# Patient Record
Sex: Female | Born: 1990 | Race: White | Hispanic: No | Marital: Single | Smoking: Never smoker
Health system: Southern US, Community
[De-identification: ages and names within clinical notes are randomized; demographics above are authoritative.]

---

## 2011-08-14 ENCOUNTER — Emergency Department (HOSPITAL_COMMUNITY)
Admission: EM | Admit: 2011-08-14 | Discharge: 2011-08-14 | Disposition: A | Discharge status: Home / Self Care | Payer: BC Managed Care – PPO | Attending: Emergency Medicine | Admitting: Emergency Medicine

## 2011-08-14 ENCOUNTER — Encounter (HOSPITAL_COMMUNITY): Payer: Self-pay | Admitting: Family Medicine

## 2011-08-14 ENCOUNTER — Emergency Department (HOSPITAL_COMMUNITY): Payer: BC Managed Care – PPO

## 2011-08-14 DIAGNOSIS — R112 Nausea with vomiting, unspecified: Secondary | ICD-10-CM | POA: Insufficient documentation

## 2011-08-14 DIAGNOSIS — R109 Unspecified abdominal pain: Secondary | ICD-10-CM | POA: Insufficient documentation

## 2011-08-14 DIAGNOSIS — R1110 Vomiting, unspecified: Secondary | ICD-10-CM

## 2011-08-14 DIAGNOSIS — R197 Diarrhea, unspecified: Secondary | ICD-10-CM | POA: Insufficient documentation

## 2011-08-14 LAB — DIFFERENTIAL
Basophils Absolute: 0 10*3/uL (ref 0.0–0.1)
Lymphocytes Relative: 2 % — ABNORMAL LOW (ref 12–46)
Neutro Abs: 15.5 10*3/uL — ABNORMAL HIGH (ref 1.7–7.7)

## 2011-08-14 LAB — COMPREHENSIVE METABOLIC PANEL
ALT: 10 U/L (ref 0–35)
AST: 15 U/L (ref 0–37)
Albumin: 4.7 g/dL (ref 3.5–5.2)
CO2: 21 mEq/L (ref 19–32)
Calcium: 9.7 mg/dL (ref 8.4–10.5)
Chloride: 101 mEq/L (ref 96–112)
GFR calc non Af Amer: >90 mL/min (ref >90)
Sodium: 138 mEq/L (ref 135–145)

## 2011-08-14 LAB — CBC
Platelets: 271 10*3/uL (ref 150–400)
RDW: 12.6 % (ref 11.5–15.5)
WBC: 16.3 10*3/uL — ABNORMAL HIGH (ref 4.0–10.5)

## 2011-08-14 MED ORDER — IOHEXOL 300 MG/ML  SOLN
100.0000 mL | Freq: Once | INTRAMUSCULAR | Status: AC | PRN
  Administered 2011-08-14: 80 mL via INTRAVENOUS

## 2011-08-14 MED ORDER — MORPHINE SULFATE 4 MG/ML IJ SOLN
4.0000 mg | Freq: Once | INTRAMUSCULAR | Status: AC
  Administered 2011-08-14: 4 mg via INTRAVENOUS
  Filled 2011-08-14: qty 1

## 2011-08-14 MED ORDER — ONDANSETRON HCL 4 MG/2ML IJ SOLN
4.0000 mg | Freq: Once | INTRAMUSCULAR | Status: AC
  Administered 2011-08-14: 4 mg via INTRAVENOUS
  Filled 2011-08-14: qty 2

## 2011-08-14 MED ORDER — ONDANSETRON HCL 4 MG PO TABS: 4.0000 mg | ORAL_TABLET | Freq: Four times a day (QID) | ORAL | Status: AC

## 2011-08-14 MED ORDER — PROMETHAZINE HCL 25 MG/ML IJ SOLN
25.0000 mg | Freq: Once | INTRAMUSCULAR | Status: AC
  Administered 2011-08-14: 25 mg via INTRAVENOUS

## 2011-08-14 MED ORDER — PROMETHAZINE HCL 25 MG/ML IJ SOLN
12.5000 mg | Freq: Once | INTRAMUSCULAR | Status: DC
  Filled 2011-08-14: qty 1

## 2011-08-14 NOTE — ED Notes (Signed)
Pt from Health center at [facility] with high WBC and abdominal pain.

## 2011-08-14 NOTE — ED Notes (Addendum)
Drinking contrast for CT---c/o nausea.  Family member at bedside.

## 2011-08-14 NOTE — ED Notes (Signed)
MD in to discuss CT results with pt. And Mom---Sprite given per pt's request.

## 2011-08-14 NOTE — ED Notes (Signed)
Sleeping on carrier--awakens with verbal---Has about 200 more ml left in saline bag---Mom wants to wait until she receives all fluid before discharge.

## 2011-08-14 NOTE — ED Provider Notes (Signed)
History     CSN: [id number]  Arrival date & time 08/14/11  1617   First MD Initiated Contact with Patient 08/14/11 1736      Chief Complaint  Patient presents with  . Nausea  . Emesis  . Diarrhea  . Abdominal Pain    (Consider location/radiation/quality/duration/timing/severity/associated sxs/prior treatment) Patient is a 21 y.o. female presenting with vomiting, diarrhea, and abdominal pain. The history is provided by the patient.  Emesis  This is a new problem. The current episode started 6 to 12 hours ago. The problem occurs continuously. The problem has not changed since onset.The emesis has an appearance of stomach contents. The maximum temperature recorded prior to her arrival was 100 to 100.9 F. The fever has been present for less than 1 day. Associated symptoms include abdominal pain, chills, diarrhea and a fever. Pertinent negatives include no cough and no URI. Risk factors: No ill contacts, suspected food intake or recent travel antibiotics.  Diarrhea The primary symptoms include fever, abdominal pain, vomiting and diarrhea. The illness began today (Only one episode of mild loose stool). The onset was sudden. The problem has been resolved.  The abdominal pain began today. The abdominal pain has been gradually worsening since its onset. The abdominal pain is located in the RLQ, periumbilical region and epigastric region. The abdominal pain does not radiate. The severity of the abdominal pain is 7/10. The abdominal pain is relieved by vomiting, movement and certain positions.  The illness is also significant for chills. Associated medical issues do not include gallstones or PUD.  Abdominal Pain The primary symptoms of the illness include abdominal pain, fever, vomiting and diarrhea.  Additional symptoms associated with the illness include chills. Significant associated medical issues do not include PUD or gallstones.    History reviewed. No pertinent past medical  history.  History reviewed. No pertinent past surgical history.  History reviewed. No pertinent family history.  History  Substance Use Topics  . Smoking status: Never Smoker   . Smokeless tobacco: Not on file  . Alcohol Use: No    OB History    Grav Para Term Preterm Abortions TAB SAB Ect Mult Living                  Review of Systems  Constitutional: Positive for fever and chills.  Respiratory: Negative for cough.   Gastrointestinal: Positive for vomiting, abdominal pain and diarrhea.  All other systems reviewed and are negative.    Allergies  Penicillins  Home Medications  No current outpatient prescriptions on file.  BP 134/72  Pulse 107  Temp(Src) 99.9 F (37.7 C) (Oral)  Resp 20  Ht 5\' 7"  (1.702 m)  Wt 124 lb (56.246 kg)  BMI 19.42 kg/m2  SpO2 100%  Physical Exam  Nursing note and vitals reviewed. Constitutional: She is oriented to person, place, and time. She appears well-developed and well-nourished. She appears distressed.  HENT:  Head: Normocephalic and atraumatic.  Mouth/Throat: Mucous membranes are dry.  Eyes: EOM are normal. Pupils are equal, round, and reactive to light.  Cardiovascular: Regular rhythm, normal heart sounds and intact distal pulses.  Tachycardia present.  Exam reveals no friction rub.   No murmur heard. Pulmonary/Chest: Effort normal and breath sounds normal. She has no wheezes. She has no rales.  Abdominal: Soft. Bowel sounds are normal. She exhibits no distension. There is tenderness in the right lower quadrant and periumbilical area. There is guarding. There is no rebound.  Musculoskeletal: Normal range of motion. She  exhibits no tenderness.       No edema  Neurological: She is alert and oriented to person, place, and time. No cranial nerve deficit.  Skin: Skin is warm and dry. No rash noted.  Psychiatric: She has a normal mood and affect. Her behavior is normal.    ED Course  Procedures (including critical care  time)  Labs Reviewed  CBC - Abnormal; Notable for the following:    WBC 16.3 (*)    All other components within normal limits  DIFFERENTIAL - Abnormal; Notable for the following:    Neutrophils Relative 95 (*)    Neutro Abs 15.5 (*)    Lymphocytes Relative 2 (*)    Lymphs Abs 0.4 (*)    All other components within normal limits  COMPREHENSIVE METABOLIC PANEL - Abnormal; Notable for the following:    Potassium 3.4 (*)    Glucose, Bld 114 (*)    Total Protein 8.4 (*)    All other components within normal limits   Ct Abdomen Pelvis W Contrast  08/14/2011  *RADIOLOGY REPORT*  Clinical Data: Nausea, vomiting and upper abdominal pain.  Elevated white blood cell count.  CT ABDOMEN AND PELVIS WITH CONTRAST  Technique:  Multidetector CT imaging of the abdomen and pelvis was performed following the standard protocol during bolus administration of intravenous contrast.  Contrast: 80mL OMNIPAQUE IOHEXOL 300 MG/ML IV SOLN  Comparison: None.  Findings: The lung bases are clear.  No pleural or pericardial effusion.  The liver, gallbladder, adrenal glands, spleen, pancreas and kidneys appear normal.  The appendix is visualized and normal in appearance.  The stomach and small and large bowel are normal in appearance.  Small amount of free pelvic fluid is compatible with physiologic change.  Uterus and adnexa are normal in appearance. There is no focal bony abnormality.  IMPRESSION: Negative exam.  No finding to explain the patient's symptoms.  Original Report Authenticated By: Bernadene Bell. Maricela Curet, M.D.     No diagnosis found.    MDM   The patient presented with nausea vomiting mild diarrhea and abdominal pain. She was seen at an urgent care and sent here for further evaluation patient had elevated white blood cell count of 13.9 But denies any urinary symptoms and was HCG negative.  Patient's complaining of persistent right abdominal pain. She has no rebound but does have some involuntary guarding in the  right lower quadrant. Will give IV hydration, antibiotic and pain medication. CMP, UA, CT of abdomen and pelvis to rule out appendicitis pending.  8:58 PM  Leukocytosis of 16,000 but otherwise normal CMP, UA and CT of the abdomen negative for appendicitis. On reevaluation nausea and pain have improved and patient is tolerating oral fluids. Full discharge and have return for any problems.      Gwyneth Sprout, MD 08/14/11 2059

## 2011-08-14 NOTE — ED Notes (Signed)
Pt reports waking up at 04:00 this morning with severe N/V/D and upper abdominal pain. Reports dark vaginal discharge a few days ago.

## 2012-12-03 IMAGING — CT CT ABD-PELV W/ CM
1 of 2 series · 15 of 32 positions shown, 20 images · IV contrast (APPLIED)
Comparison: None.

CLINICAL DATA: Nausea, vomiting and upper abdominal pain.  Elevated
white blood cell count.

CT ABDOMEN AND PELVIS WITH CONTRAST
TECHNIQUE: Multidetector CT imaging of the abdomen and pelvis was
performed following the standard protocol during bolus
administration of intravenous contrast.
Contrast: 80mL OMNIPAQUE IOHEXOL 300 MG/ML IV SOLN

[Series 2: abd/pel with · axial · 0.79mm/px · z∈[+842,+1257]mm · 15 of 91 slices shown, 20 images]
[im 4/91  soft-tissue]
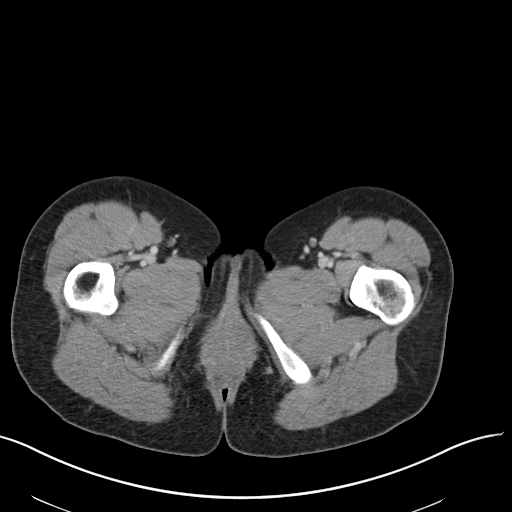
[im 4/91  bone]
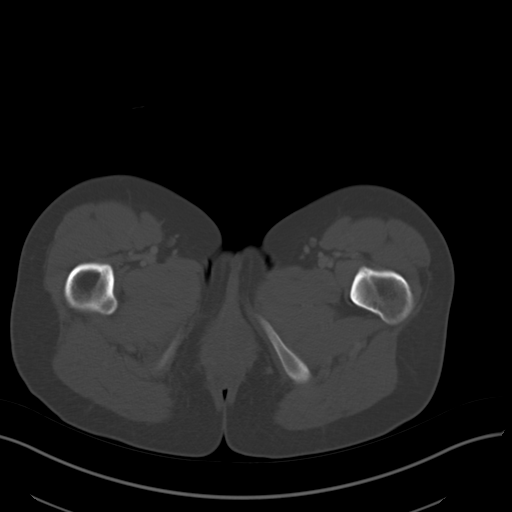
[im 12/91  soft-tissue]
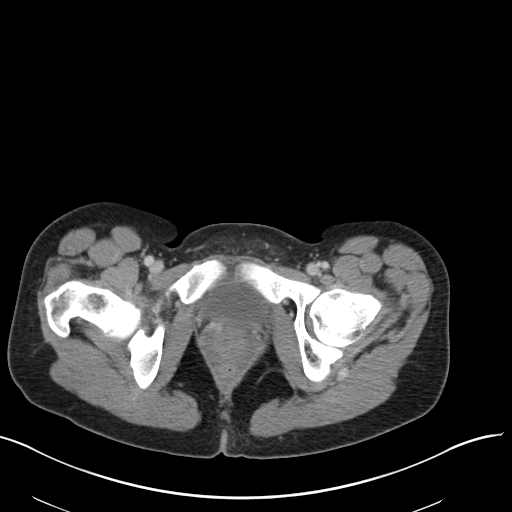
[im 16/91  soft-tissue]
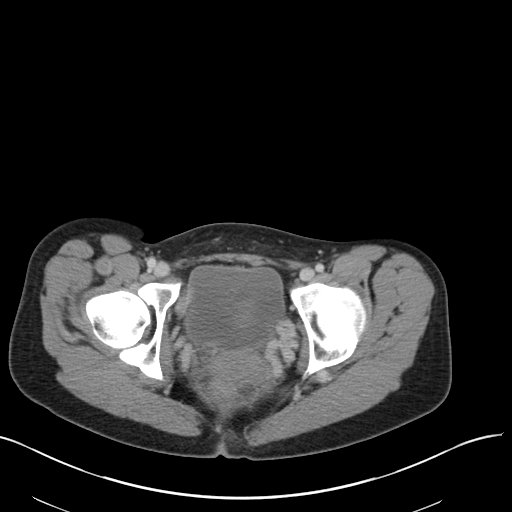
[im 24/91  soft-tissue]
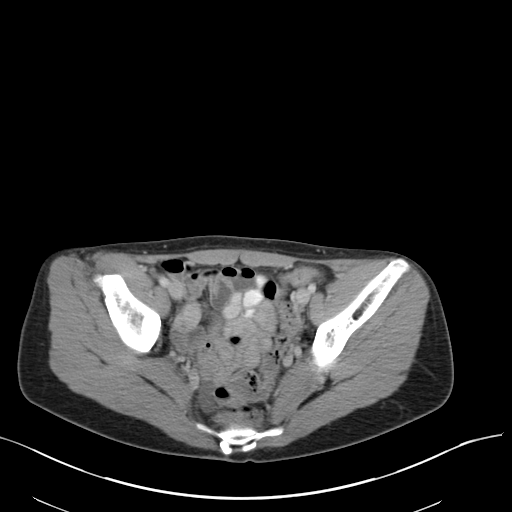
[im 32/91  soft-tissue]
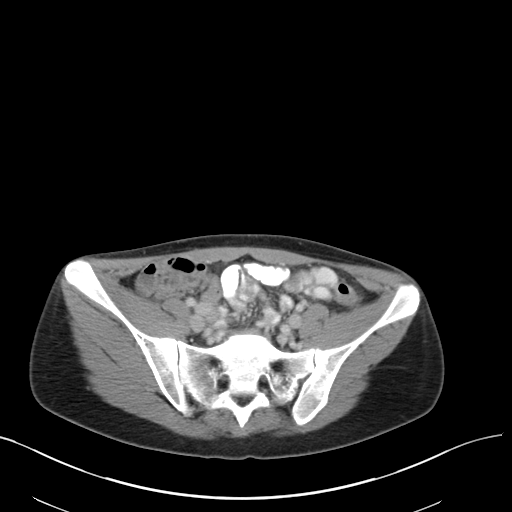
[im 36/91  soft-tissue]
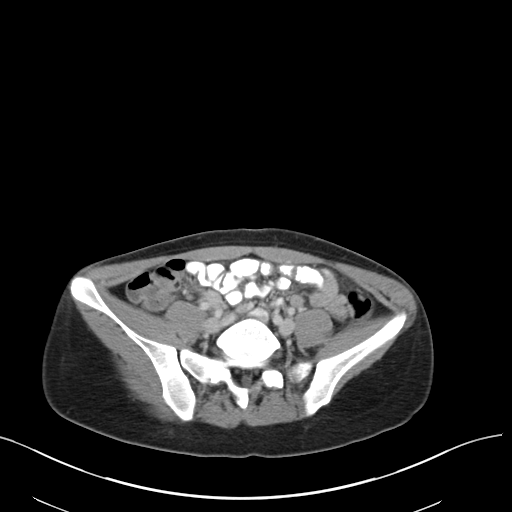
[im 44/91  soft-tissue]
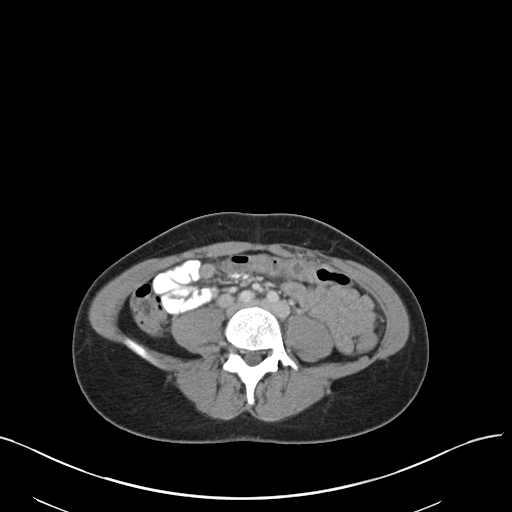
[im 47/91  soft-tissue]
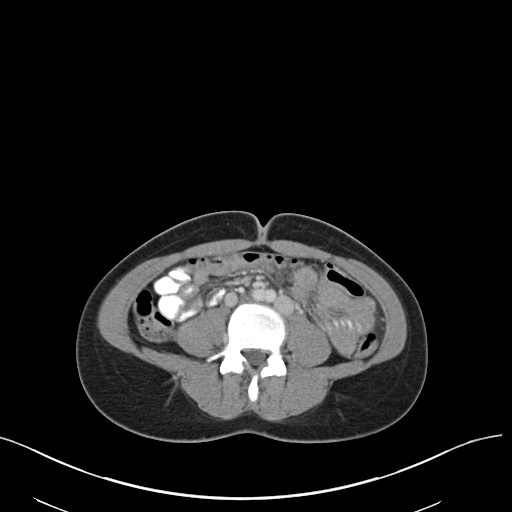
[im 55/91  soft-tissue]
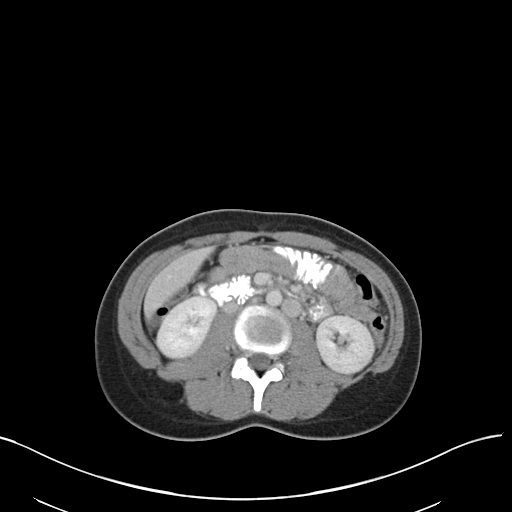
[im 55/91  bone]
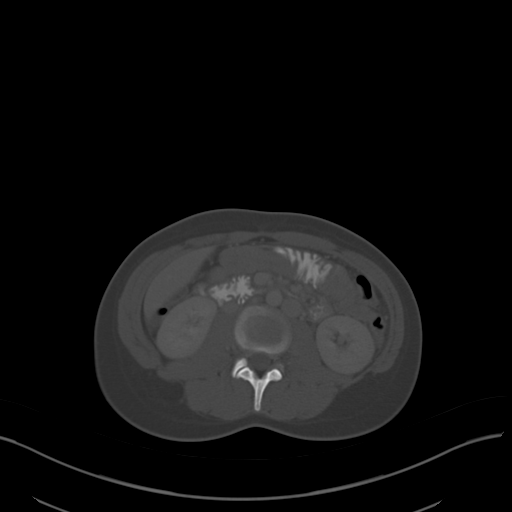
[im 59/91  soft-tissue]
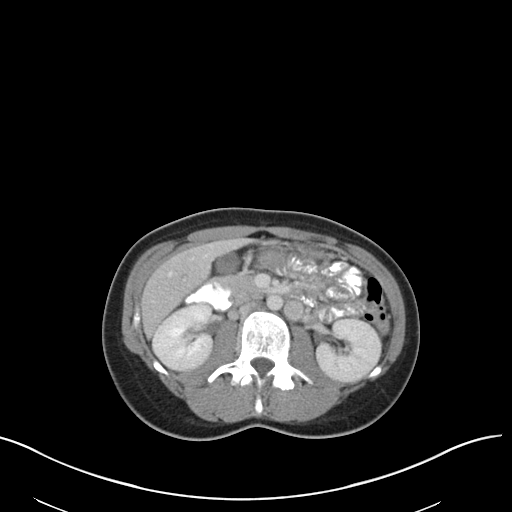
[im 67/91  soft-tissue]
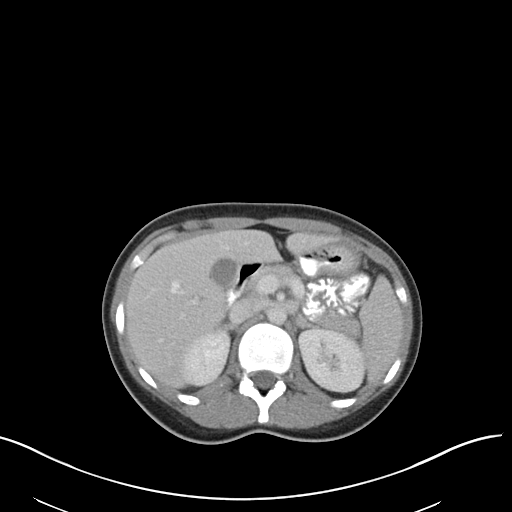
[im 75/91  soft-tissue]
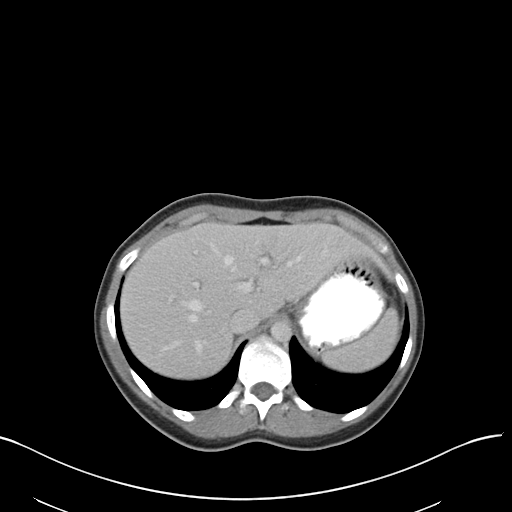
[im 75/91  lung]
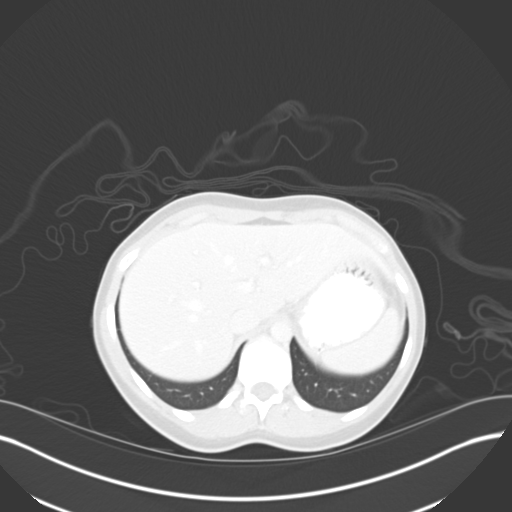
[im 79/91  soft-tissue]
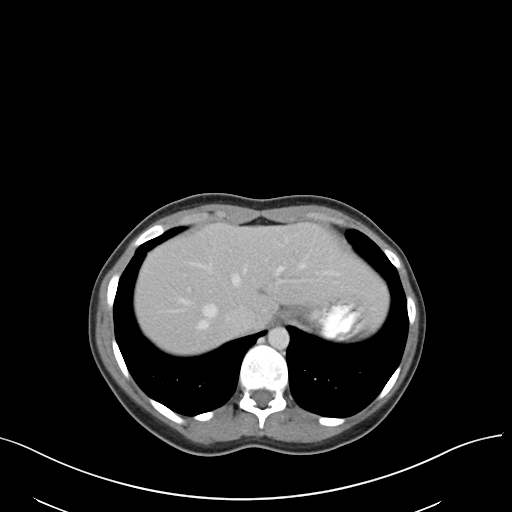
[im 79/91  lung]
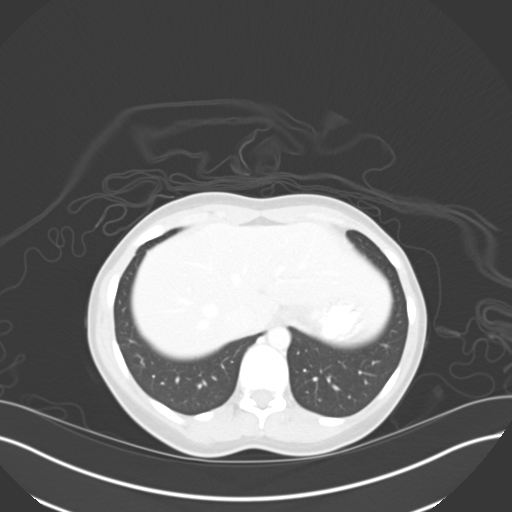
[im 83/91  lung]
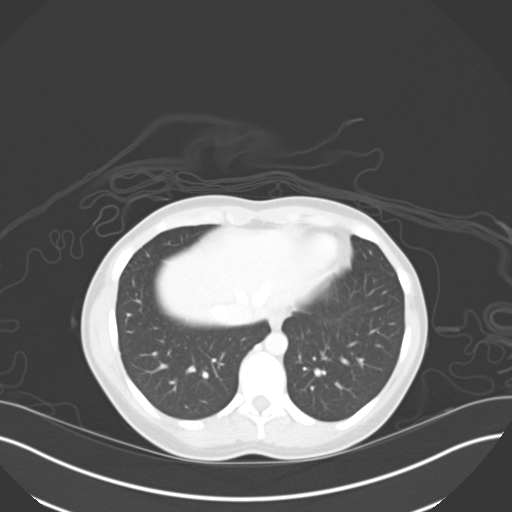
[im 87/91  soft-tissue]
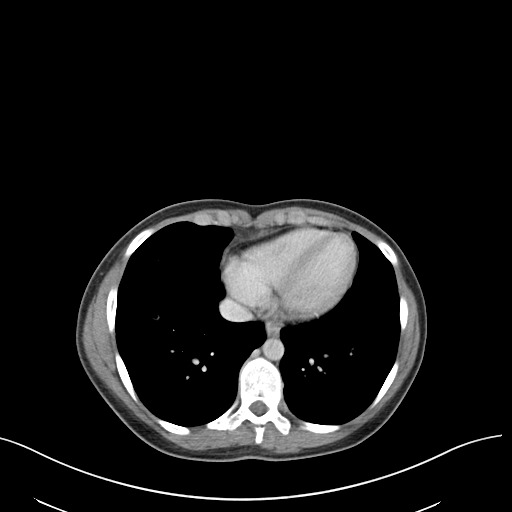
[im 87/91  lung]
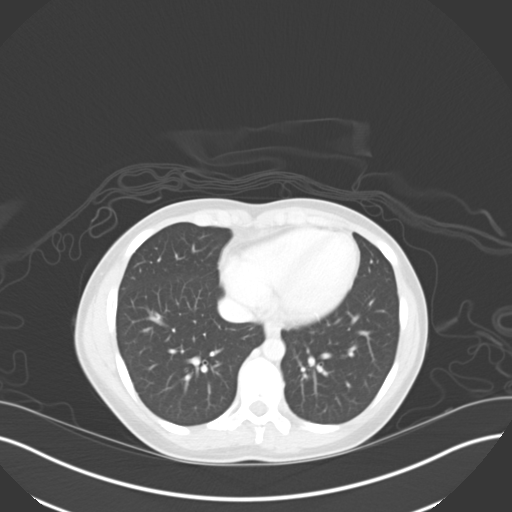

[15 of 32 positions shown; findings below may reference images not displayed]

FINDINGS: The lung bases are clear.  No pleural or pericardial
effusion.

The liver, gallbladder, adrenal glands, spleen, pancreas and
kidneys appear normal.  The appendix is visualized and normal in
appearance.  The stomach and small and large bowel are normal in
appearance.  Small amount of free pelvic fluid is compatible with
physiologic change.  Uterus and adnexa are normal in appearance.
There is no focal bony abnormality.
IMPRESSION: Negative exam.  No finding to explain the patient's symptoms.

## 2013-08-16 ENCOUNTER — Other Ambulatory Visit: Payer: Self-pay | Admitting: Gastroenterology

## 2013-08-16 DIAGNOSIS — R112 Nausea with vomiting, unspecified: Secondary | ICD-10-CM

## 2013-08-23 ENCOUNTER — Other Ambulatory Visit: Payer: Self-pay | Admitting: Gastroenterology

## 2013-08-23 ENCOUNTER — Ambulatory Visit
Admission: RE | Admit: 2013-08-23 | Discharge: 2013-08-23 | Disposition: A | Discharge status: Home / Self Care | Payer: BC Managed Care – PPO | Source: Ambulatory Visit | Attending: Gastroenterology | Admitting: Gastroenterology

## 2013-08-23 DIAGNOSIS — R112 Nausea with vomiting, unspecified: Secondary | ICD-10-CM

## 2014-12-13 IMAGING — US US ABDOMEN LIMITED
1 series · 14 of 25 positions shown · non-contrast
Comparison: None.

CLINICAL DATA: Nausea and vomiting

EXAM:
US ABDOMEN LIMITED - RIGHT UPPER QUADRANT

[Series 1: us abdomen limited · 0.22mm/px · 14 of 40 slices shown]
[im 1/40]
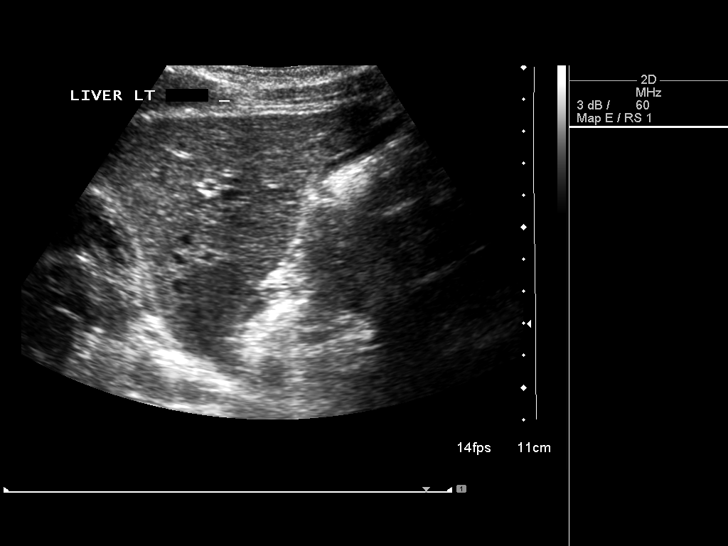
[im 4/40]
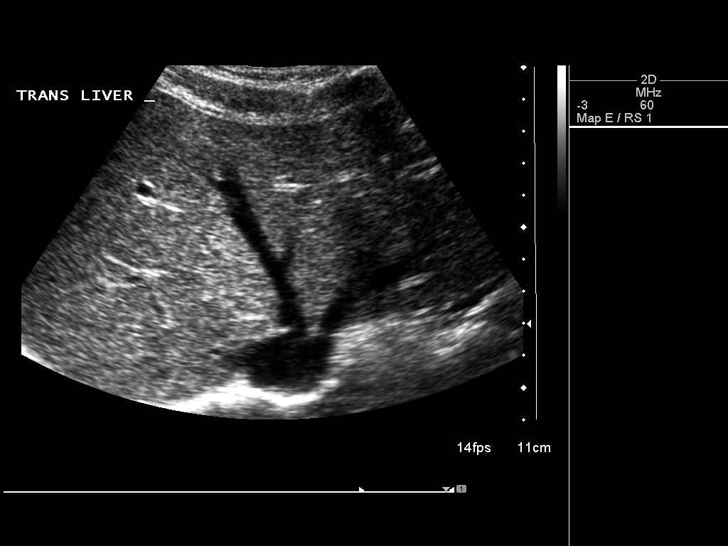
[im 7/40]
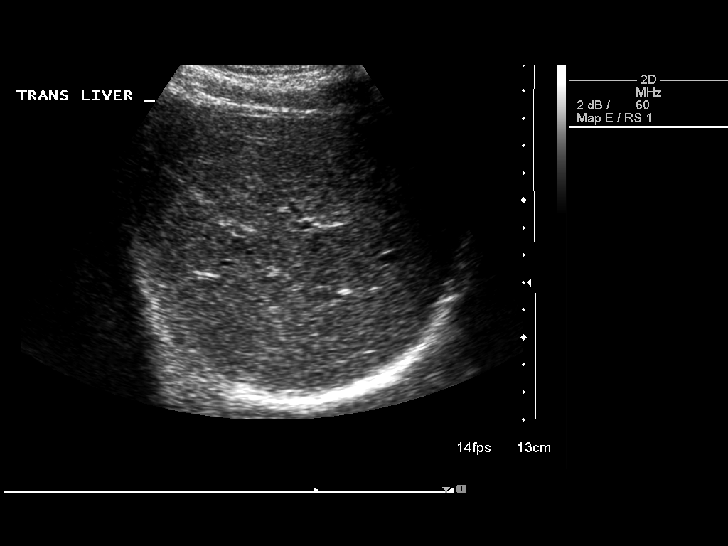
[im 10/40]
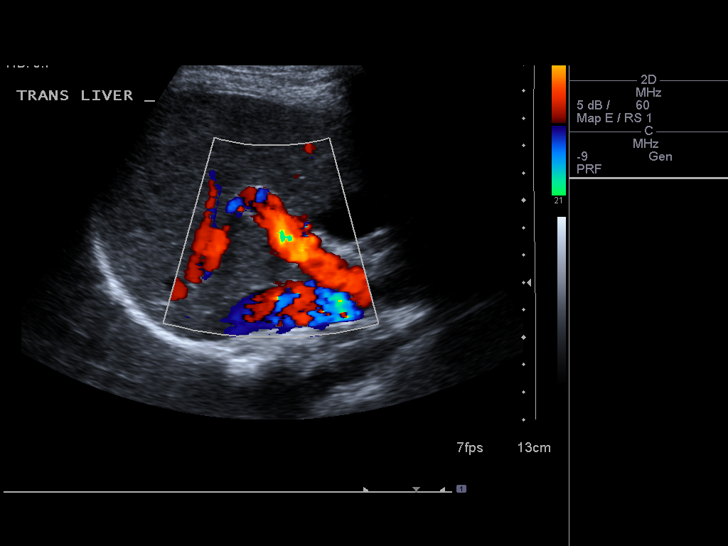
[im 14/40]
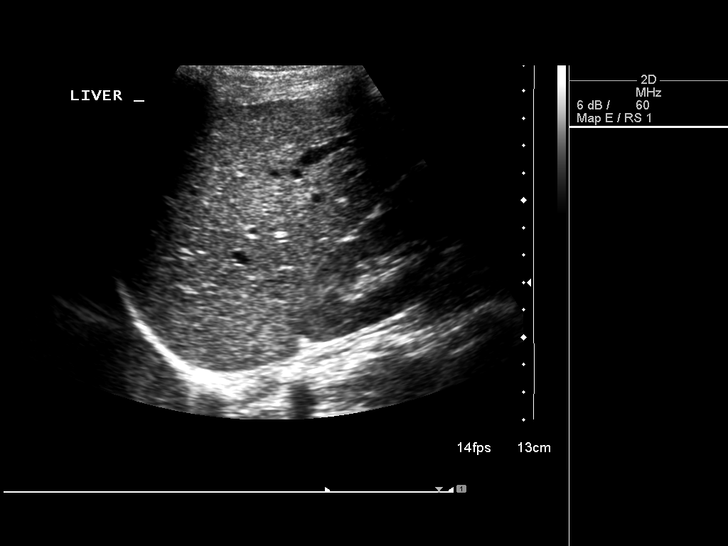
[im 15/40]
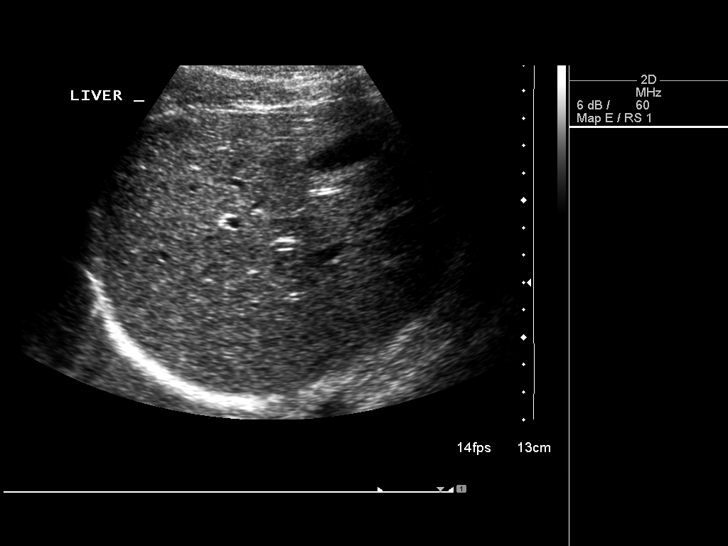
[im 18/40]
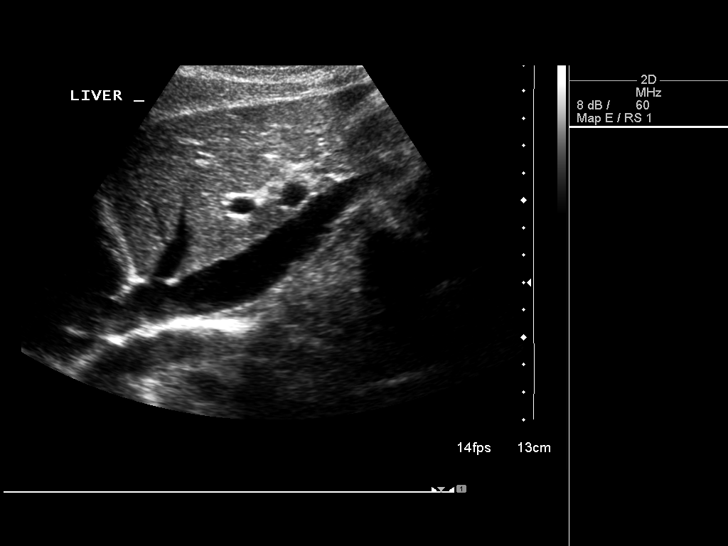
[im 22/40]
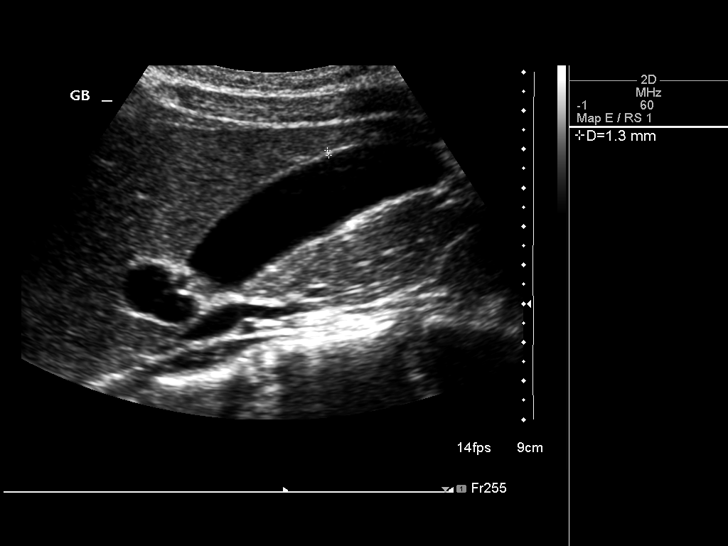
[im 25/40]
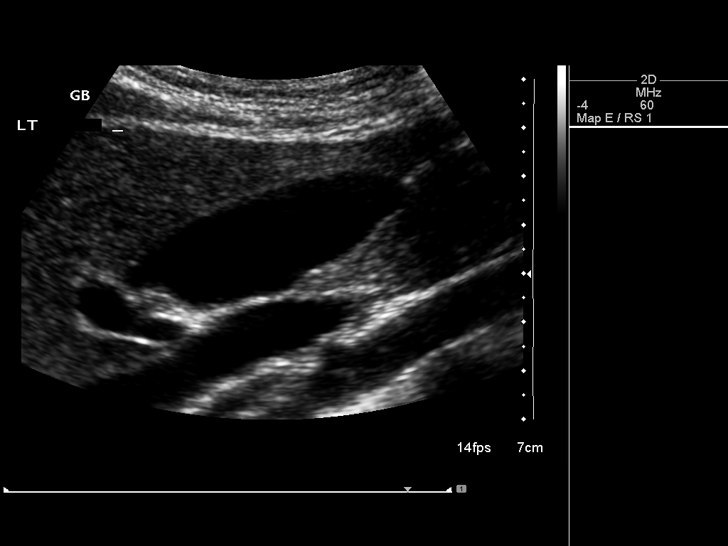
[im 27/40]
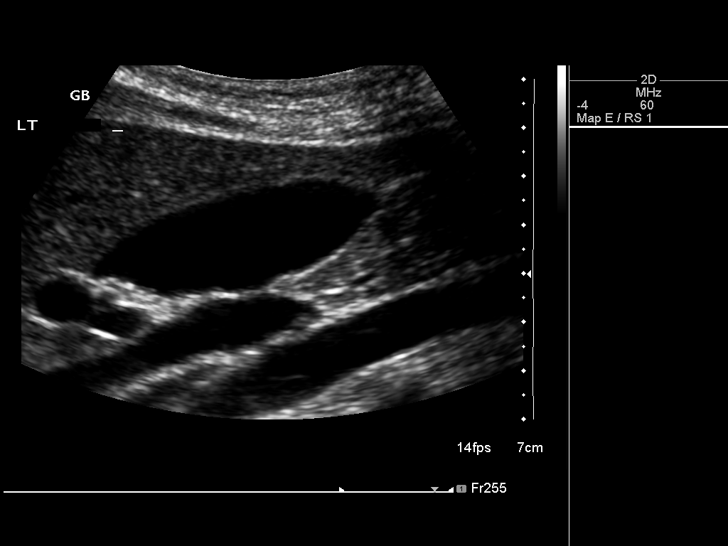
[im 30/40]
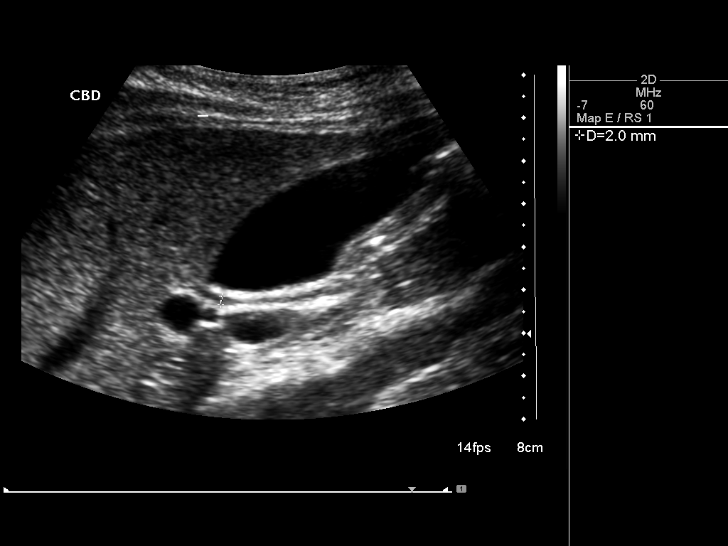
[im 33/40]
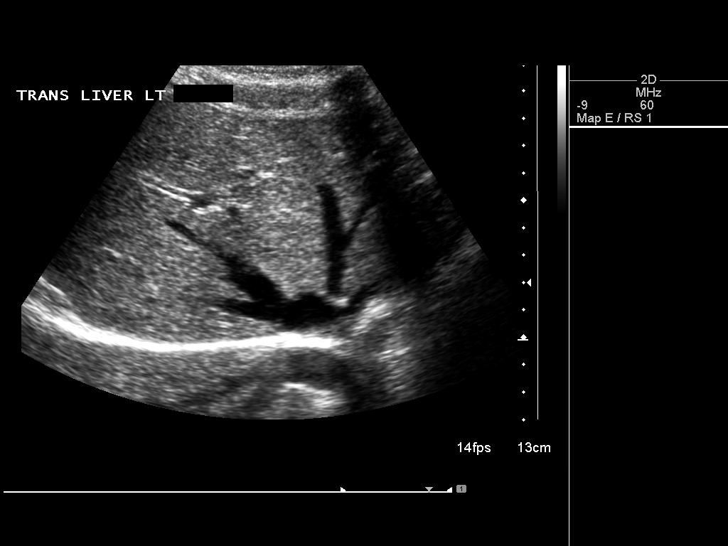
[im 36/40]
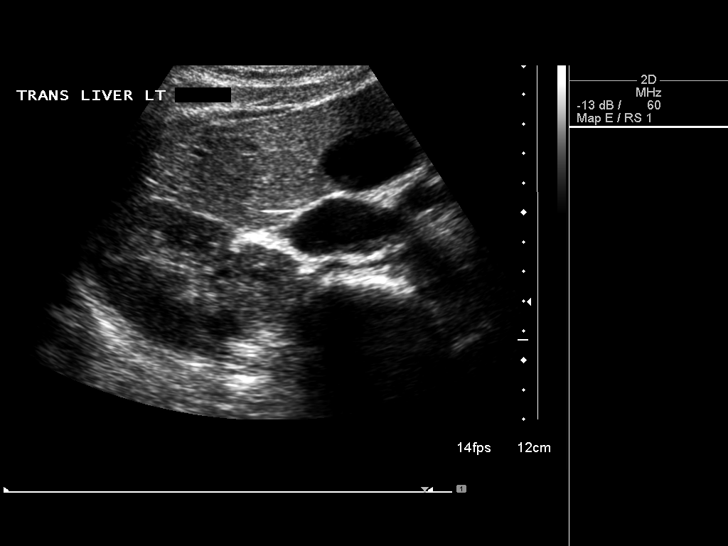
[im 40/40]
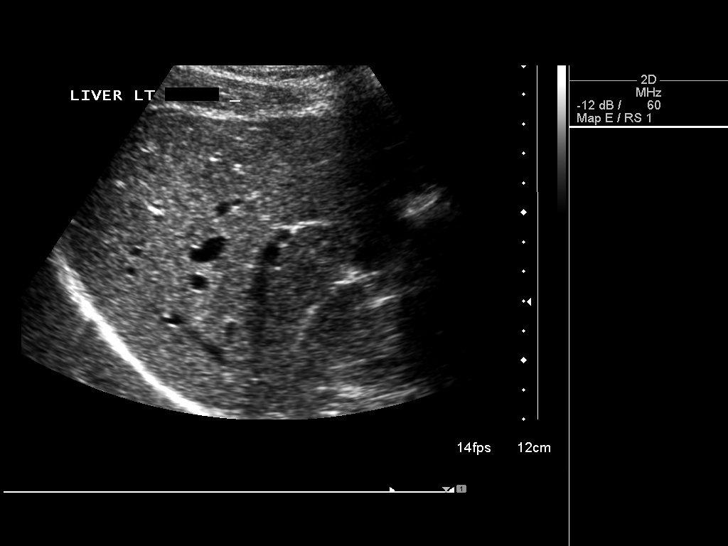

[14 of 25 positions shown; findings below may reference images not displayed]

FINDINGS: Gallbladder:

No gallstones or wall thickening visualized. No sonographic Murphy
sign noted.

Common bile duct:

Diameter: 2 mm

Liver:

No focal lesion identified. Within normal limits in parenchymal
echogenicity.
IMPRESSION: Negative exam.
